# Patient Record
Sex: Male | Born: 1985 | Race: White | Hispanic: No | Marital: Married | Smoking: Former smoker
Health system: Southern US, Community
[De-identification: ages and names within clinical notes are randomized; demographics above are authoritative.]

## PROBLEM LIST (undated history)

## (undated) DIAGNOSIS — F419 Anxiety disorder, unspecified: Secondary | ICD-10-CM

## (undated) DIAGNOSIS — E291 Testicular hypofunction: Secondary | ICD-10-CM

## (undated) DIAGNOSIS — E559 Vitamin D deficiency, unspecified: Secondary | ICD-10-CM

## (undated) DIAGNOSIS — K219 Gastro-esophageal reflux disease without esophagitis: Secondary | ICD-10-CM

## (undated) DIAGNOSIS — B019 Varicella without complication: Secondary | ICD-10-CM

## (undated) DIAGNOSIS — K589 Irritable bowel syndrome without diarrhea: Secondary | ICD-10-CM

## (undated) HISTORY — DX: Irritable bowel syndrome, unspecified: K58.9

## (undated) HISTORY — DX: Testicular hypofunction: E29.1

## (undated) HISTORY — DX: Vitamin D deficiency, unspecified: E55.9

## (undated) HISTORY — DX: Varicella without complication: B01.9

## (undated) HISTORY — PX: TONSILLECTOMY: SHX5217

## (undated) HISTORY — DX: Gastro-esophageal reflux disease without esophagitis: K21.9

## (undated) HISTORY — DX: Anxiety disorder, unspecified: F41.9

---

## 2011-10-02 ENCOUNTER — Other Ambulatory Visit: Payer: Self-pay | Admitting: Occupational Medicine

## 2011-10-02 ENCOUNTER — Ambulatory Visit
Admission: RE | Admit: 2011-10-02 | Discharge: 2011-10-02 | Disposition: A | Discharge status: Home / Self Care | Payer: PRIVATE HEALTH INSURANCE | Source: Ambulatory Visit | Attending: Occupational Medicine | Admitting: Occupational Medicine

## 2011-10-02 DIAGNOSIS — Z021 Encounter for pre-employment examination: Secondary | ICD-10-CM

## 2011-11-03 HISTORY — PX: WISDOM TOOTH EXTRACTION: SHX21

## 2012-06-11 ENCOUNTER — Encounter (HOSPITAL_COMMUNITY): Payer: Self-pay | Admitting: *Deleted

## 2012-06-11 ENCOUNTER — Emergency Department (HOSPITAL_COMMUNITY)
Admission: EM | Admit: 2012-06-11 | Discharge: 2012-06-11 | Disposition: A | Discharge status: Home / Self Care | Payer: Worker's Compensation | Attending: Emergency Medicine | Admitting: Emergency Medicine

## 2012-06-11 DIAGNOSIS — Y9289 Other specified places as the place of occurrence of the external cause: Secondary | ICD-10-CM | POA: Insufficient documentation

## 2012-06-11 DIAGNOSIS — Y990 Civilian activity done for income or pay: Secondary | ICD-10-CM | POA: Insufficient documentation

## 2012-06-11 DIAGNOSIS — S0191XA Laceration without foreign body of unspecified part of head, initial encounter: Secondary | ICD-10-CM

## 2012-06-11 DIAGNOSIS — S0100XA Unspecified open wound of scalp, initial encounter: Secondary | ICD-10-CM | POA: Insufficient documentation

## 2012-06-11 DIAGNOSIS — W268XXA Contact with other sharp object(s), not elsewhere classified, initial encounter: Secondary | ICD-10-CM | POA: Insufficient documentation

## 2012-06-11 NOTE — ED Provider Notes (Signed)
Medical screening examination/treatment/procedure(s) were performed by non-physician practitioner and as supervising physician I was immediately available for consultation/collaboration.   Gwyneth Sprout, MD 06/11/12 1413

## 2012-06-11 NOTE — ED Provider Notes (Signed)
History     CSN: [id number]  Arrival date & time 06/11/12  1152   First MD Initiated Contact with Patient 06/11/12 1206      Chief Complaint  Patient presents with  . Head Laceration    (Consider location/radiation/quality/duration/timing/severity/associated sxs/prior treatment) HPI Comments: 26 y/o male presents with head laceration s/p hitting his head on the corner of the car door while trying to avoid being hit by a tree branch. Patient was at work and presents with his 2 co-workers. He is a Nurse, adult. Denies any LOC, dizziness, visual disturbance. States he has had a tetanus shot within the past 5 years.   Patient is a 26 y.o. male presenting with scalp laceration. The history is provided by the patient (and co-workers).  Head Laceration    History reviewed. No pertinent past medical history.  History reviewed. No pertinent past surgical history.  No family history on file.  History  Substance Use Topics  . Smoking status: Never Smoker   . Smokeless tobacco: Not on file  . Alcohol Use: Yes      Review of Systems  Eyes: Negative for visual disturbance.  Skin: Positive for wound.  Neurological: Negative for dizziness.       No LOC    Allergies  Review of patient's allergies indicates no known allergies.  Home Medications   Current Outpatient Rx  Name Route Sig Dispense Refill  . NAPROXEN SODIUM 220 MG PO TABS Oral Take 220 mg by mouth 2 (two) times daily with a meal.      BP 137/88  Resp 18  SpO2 97%  Physical Exam  Constitutional: He is oriented to person, place, and time. He appears well-developed and well-nourished. No distress.  HENT:  Head: Normocephalic.       1 cm superficial laceration on left side of scalp  Cardiovascular: Normal rate, regular rhythm and normal heart sounds.   Pulmonary/Chest: Effort normal and breath sounds normal.  Neurological: He is alert and oriented to person, place, and time. Coordination normal.  Skin: Skin is warm  and dry. Laceration (1 cm superficial laceration on left side of scalp. no evidence of foreign body.) noted.  Psychiatric: He has a normal mood and affect. His behavior is normal.    ED Course  Procedures (including critical care time) LACERATION REPAIR Performed by: Erik Stafford Authorized by: Erik Stafford Consent: Verbal consent obtained. Risks and benefits: risks, benefits and alternatives were discussed Consent given by: patient Patient identity confirmed: provided demographic data Prepped and Draped in normal sterile fashion Wound explored  Laceration Location: scalp  Laceration Length: 1cm  No Foreign Bodies seen or palpated  Anesthesia: local infiltration  Local anesthetic: lidocaine 2% without epinephrine  Anesthetic total: 1 ml  Irrigation method: syringe Amount of cleaning: standard  Skin closure: 3-0 prolene  Number of sutures: 1  Technique: simple interrupted  Patient tolerance: Patient tolerated the procedure well with no immediate complications.  Labs Reviewed - No data to display No results found.   1. Laceration of head       MDM  26 y/o male with scalp laceration s/p hitting on car door. No LOC. No foreign bodies seen or palpated. 1 suture places without problem. Patient in NAD. Had tetanus vaccine within past 5 years.        Erik Mace, PA-C 06/11/12 1256

## 2012-06-11 NOTE — ED Notes (Signed)
Pt states he hit his head on a car door as he leaned down to enter the car.  Pt denies LOC, N/V.  Pt has 2 inch lac to left medial skull.  Bleeding is controlled.

## 2012-09-20 ENCOUNTER — Ambulatory Visit: Payer: Self-pay | Admitting: Internal Medicine

## 2013-03-20 ENCOUNTER — Ambulatory Visit: Payer: Self-pay | Admitting: General Practice

## 2013-10-23 ENCOUNTER — Emergency Department (HOSPITAL_COMMUNITY)
Admission: EM | Admit: 2013-10-23 | Discharge: 2013-10-24 | Disposition: A | Discharge status: Home / Self Care | Payer: Worker's Compensation | Attending: Emergency Medicine | Admitting: Emergency Medicine

## 2013-10-23 ENCOUNTER — Encounter (HOSPITAL_COMMUNITY): Payer: Self-pay | Admitting: Emergency Medicine

## 2013-10-23 DIAGNOSIS — Z206 Contact with and (suspected) exposure to human immunodeficiency virus [HIV]: Secondary | ICD-10-CM

## 2013-10-23 DIAGNOSIS — Z7721 Contact with and (suspected) exposure to potentially hazardous body fluids: Secondary | ICD-10-CM | POA: Insufficient documentation

## 2013-10-23 DIAGNOSIS — Z791 Long term (current) use of non-steroidal anti-inflammatories (NSAID): Secondary | ICD-10-CM | POA: Insufficient documentation

## 2013-10-23 DIAGNOSIS — Z20828 Contact with and (suspected) exposure to other viral communicable diseases: Secondary | ICD-10-CM | POA: Insufficient documentation

## 2013-10-23 NOTE — ED Notes (Signed)
Pt was exposed to a person with known HIV, pt here for exposure panel and prophylaxis medications

## 2013-10-24 LAB — COMPREHENSIVE METABOLIC PANEL
ALT: 28 U/L (ref 0–53)
Alkaline Phosphatase: 62 U/L (ref 39–117)
CO2: 25 mEq/L (ref 19–32)
Chloride: 104 mEq/L (ref 96–112)
GFR calc Af Amer: >90 mL/min (ref >90)
GFR calc non Af Amer: >90 mL/min (ref >90)
Glucose, Bld: 90 mg/dL (ref 70–99)
Potassium: 3.9 mEq/L (ref 3.5–5.1)
Sodium: 141 mEq/L (ref 135–145)
Total Bilirubin: 0.4 mg/dL (ref 0.3–1.2)
Total Protein: 7.2 g/dL (ref 6.0–8.3)

## 2013-10-24 LAB — CBC WITH DIFFERENTIAL/PLATELET
Eosinophils Absolute: 0.1 10*3/uL (ref 0.0–0.7)
Hemoglobin: 15.6 g/dL (ref 13.0–17.0)
Lymphocytes Relative: 29 % (ref 12–46)
Lymphs Abs: 2.4 10*3/uL (ref 0.7–4.0)
MCH: 32.2 pg (ref 26.0–34.0)
Neutro Abs: 5.2 10*3/uL (ref 1.7–7.7)
Neutrophils Relative %: 64 % (ref 43–77)
Platelets: 179 10*3/uL (ref 150–400)
RBC: 4.85 MIL/uL (ref 4.22–5.81)
RDW: 12.7 % (ref 11.5–15.5)
WBC: 8.1 10*3/uL (ref 4.0–10.5)

## 2013-10-24 LAB — URINALYSIS, ROUTINE W REFLEX MICROSCOPIC
Leukocytes, UA: NEGATIVE
Nitrite: NEGATIVE
Protein, ur: NEGATIVE mg/dL
Specific Gravity, Urine: 1.017 (ref 1.005–1.030)
Urobilinogen, UA: 1 mg/dL (ref 0.0–1.0)
pH: 7.5 (ref 5.0–8.0)

## 2013-10-24 LAB — RAPID HIV SCREEN (WH-MAU): Rapid HIV Screen: NONREACTIVE

## 2013-10-24 LAB — HEPATITIS PANEL, ACUTE: Hepatitis B Surface Ag: NEGATIVE

## 2013-10-24 NOTE — ED Provider Notes (Signed)
CSN: [id number]     Arrival date & time 10/23/13  2356 History   First MD Initiated Contact with Patient 10/24/13 0007     Chief Complaint  Patient presents with  . 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 He has a healing scratch on his finger and the HIV positive patients blood got on his hand.  He would like to being prophylactic treatment for HIV. 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  History reviewed. No pertinent past medical history. History reviewed. No pertinent past surgical history. History reviewed. No pertinent family history. History  Substance Use Topics  . Smoking status: Never Smoker   . Smokeless tobacco: Not on file  . Alcohol Use: Yes    Review of Systems The patient denies anorexia, fever, weight loss,, vision loss, decreased hearing, hoarseness, chest pain, syncope, dyspnea on exertion, peripheral edema, balance deficits, hemoptysis, abdominal pain, melena, hematochezia, severe indigestion/heartburn, hematuria, incontinence, genital sores, muscle weakness, suspicious skin lesions, transient blindness, difficulty walking, depression, unusual weight change, abnormal bleeding, enlarged lymph nodes, angioedema, and breast masses.  Allergies  Review of patient's allergies indicates no known allergies.  Home Medications   Current Outpatient Rx  Name  Route  Sig  Dispense  Refill  . naproxen sodium (ANAPROX) 220 MG tablet   Oral   Take 220 mg by mouth 2 (two) times daily with a meal.          There were no vitals taken for this visit. Physical Exam  Nursing note and vitals reviewed. Constitutional: He appears well-developed and well-nourished. No distress.  HENT:  Head: Normocephalic and atraumatic.  Eyes: Pupils are  equal, round, and reactive to light.  Neck: Normal range of motion. Neck supple.  Cardiovascular: Normal rate and regular rhythm.   Pulmonary/Chest: Effort normal.  Abdominal: Soft.  Musculoskeletal:  Small healing scratch to finger.  Neurological: He is alert.  Skin: Skin is warm and dry.    ED Course  Procedures (including critical care time) Labs Review Labs Reviewed  RAPID HIV SCREEN ([facility])  HEPATITIS PANEL, ACUTE  URINALYSIS, ROUTINE W REFLEX MICROSCOPIC  CBC WITH DIFFERENTIAL  COMPREHENSIVE METABOLIC PANEL  AMYLASE   Imaging Review No results found.  EKG Interpretation   None       MDM   1. HIV exposure from body fluids    Patient has had all fluid exposure labs drawn. Some of the labs are send outs and will not be back in a timely manner. Will have patient follow-up with labs tomorrow with his Health Department  Pt has no other medical concerns at this time.  27 y.o.Dejion Salinas's evaluation in the Emergency Department is complete. It has been determined that no acute conditions requiring further emergency intervention are present at this time. The patient/guardian have been advised of the diagnosis and plan. We have discussed signs and symptoms that warrant return to the ED, such as changes or worsening in symptoms.  Vital signs are stable at discharge. There were no vitals filed for this visit.  Patient/guardian has voiced understanding and agreed to follow-up with the PCP or specialist.   Dorthula Matas, PA-C 10/24/13 0045

## 2013-10-24 NOTE — ED Provider Notes (Signed)
Medical screening examination/treatment/procedure(s) were performed by non-physician practitioner and as supervising physician I was immediately available for consultation/collaboration.    Olivia Mackie, MD 10/24/13 [phone]

## 2014-08-07 ENCOUNTER — Ambulatory Visit: Payer: Self-pay | Admitting: Physician Assistant

## 2014-08-14 ENCOUNTER — Encounter: Payer: Self-pay | Admitting: *Deleted

## 2014-08-23 ENCOUNTER — Ambulatory Visit: Payer: 59

## 2014-08-23 ENCOUNTER — Encounter: Payer: Self-pay | Admitting: General Surgery

## 2014-08-23 ENCOUNTER — Ambulatory Visit (INDEPENDENT_AMBULATORY_CARE_PROVIDER_SITE_OTHER): Payer: 59 | Admitting: General Surgery

## 2014-08-23 VITALS — BP 118/76 | HR 76 | Resp 12 | Ht 71.0 in | Wt 219.0 lb

## 2014-08-23 DIAGNOSIS — N6320 Unspecified lump in the left breast, unspecified quadrant: Secondary | ICD-10-CM

## 2014-08-23 DIAGNOSIS — N62 Hypertrophy of breast: Secondary | ICD-10-CM

## 2014-08-23 DIAGNOSIS — N63 Unspecified lump in breast: Secondary | ICD-10-CM

## 2014-08-23 NOTE — Patient Instructions (Signed)
The patient is aware to call back for any questions or concerns.  

## 2014-08-23 NOTE — Progress Notes (Signed)
Patient ID: Erik Stafford, male   DOB: 03/02/1986, 28 y.o.   MRN: [id number]  Chief Complaint  Patient presents with  . Breast Problem    left breast mass    HPI Erik Stafford is a 28 y.o. male.  Here today for evaluation of a left breast lump. States it has been there about a month. He does seem to remember at first it was larger but for 3 weeks it has been stable "quarter" size. Denies pain only a little tenderness. He is here today with his wife, Erik Stafford. Denies family history of breast cancer. No known history of trauma to the area. [place].  Served in [employer], reports sleep disturbances since that time.  His has been on Lexapo and Wellbutrin in the past.  HPI  Past Medical History  Diagnosis Date  . Anxiety   . Irritable bowel syndrome   . GERD (gastroesophageal reflux disease)     Past Surgical History  Procedure Laterality Date  . Tonsillectomy    . Wisdom tooth extraction  2013    Family History  Problem Relation Age of Onset  . Leukemia Paternal Uncle   . Cancer Paternal Grandmother     stomach    Social History History  Substance Use Topics  . Smoking status: Former Smoker -- 7 years    Quit date: 11/02/2013  . Smokeless tobacco: Never Used  . Alcohol Use: Yes     Comment: 3/week    Allergies  Allergen Reactions  . Skelaxin [Metaxalone] Other (See Comments)    migraine    Current Outpatient Prescriptions  Medication Sig Dispense Refill  . clonazePAM (KLONOPIN) 1 MG tablet Take 1 mg by mouth at bedtime.      . Multiple Vitamin (MULTIVITAMIN) capsule Take 1 capsule by mouth daily.      Marland Kitchen. omeprazole (PRILOSEC) 40 MG capsule Take 80 mg by mouth daily.       No current facility-administered medications for this visit.    Review of Systems Review of Systems  Constitutional: Negative.   Respiratory: Negative.   Cardiovascular: Negative.     Blood pressure 118/76, pulse 76, resp. rate 12, height 5\' 11"  (1.803 m), weight  219 lb (99.338 kg).  Physical Exam Physical Exam  Constitutional: He is oriented to person, place, and time. He appears well-developed and well-nourished.  Neck: Neck supple.  Cardiovascular: Normal rate, regular rhythm and normal heart sounds.   Pulmonary/Chest: Effort normal and breath sounds normal. Right breast exhibits no inverted nipple, no mass, no nipple discharge, no skin change and no tenderness. Left breast exhibits no inverted nipple, no mass, no nipple discharge, no skin change and no tenderness.  3 cm wide thickening, less than 1 cm deep left breast  Lymphadenopathy:    He has no cervical adenopathy.    He has no axillary adenopathy.  Neurological: He is alert and oriented to person, place, and time.  Skin: Skin is warm and dry.    Data Reviewed Ultrasound examination of the breasts shows the left retroareolar tissue to be 0.84 x 1. 05 x 1.30 cm.  The right retroareolar bud measures 0.49 x 0.65 x 1.05. BIRAD 2.   Assessment    Gynecomastia     Plan    Options for management reviewed: 1) biopsy to confirm clinical impression (declined today secondary to upcoming [employer] responsibilities vs 2) observation. With history of tenderness and swelling improving over last few weeks, this  seems reasonable. He is not presently, nor recently taken any drugs clearly linked to gynecomastia.  This may have been traumatic initially, and now resolving.  Will plan for reassessment in three months, earlier if the patient notes any changes.       PCP: Erik Stafford, Jr., Erik Stafford   Erik Stafford, Erik Stafford 08/26/2014, 8:52 AM

## 2014-08-26 DIAGNOSIS — N62 Hypertrophy of breast: Secondary | ICD-10-CM | POA: Insufficient documentation

## 2014-10-30 ENCOUNTER — Telehealth: Payer: Self-pay | Admitting: Internal Medicine

## 2014-10-30 NOTE — Telephone Encounter (Signed)
[facility]. Sent to Dr. Marina GoodellPerry. 10/30/14/ss

## 2014-11-13 ENCOUNTER — Ambulatory Visit: Payer: Self-pay | Admitting: Internal Medicine

## 2014-11-19 ENCOUNTER — Ambulatory Visit (INDEPENDENT_AMBULATORY_CARE_PROVIDER_SITE_OTHER): Payer: 59 | Admitting: General Surgery

## 2014-11-19 ENCOUNTER — Encounter: Payer: Self-pay | Admitting: General Surgery

## 2014-11-19 VITALS — BP 122/74 | HR 76 | Resp 12 | Ht 71.0 in | Wt 216.0 lb

## 2014-11-19 DIAGNOSIS — N62 Hypertrophy of breast: Secondary | ICD-10-CM

## 2014-11-19 NOTE — Patient Instructions (Signed)
Patient to return as needed. 

## 2014-11-19 NOTE — Progress Notes (Signed)
Patient ID: Erik Stafford, male   DOB: 11/05/1985, 29 y.o.   MRN: [id number]  Chief Complaint  Patient presents with  . Follow-up    gynecomastia    HPI Erik AgentJeffrey D Bosserman is a 29 y.o. male here today for his three month follow up gynecomastia. Patient states the left breast mass has gone down in side and no more pain. He states he noticed a mass in the right breast two months ago, not much pain. He states the mass in his right breast has not got bigger over the last two months. The patient is accompanied today by his wife. HPI  Past Medical History  Diagnosis Date  . Anxiety   . Irritable bowel syndrome   . GERD (gastroesophageal reflux disease)   . Chicken pox   . Vitamin D deficiency   . Hypogonadism in male     Past Surgical History  Procedure Laterality Date  . Tonsillectomy    . Wisdom tooth extraction  2013    Family History  Problem Relation Age of Onset  . Leukemia Paternal Uncle   . Cancer Paternal Grandmother     stomach    Social History History  Substance Use Topics  . Smoking status: Former Smoker -- 7 years    Quit date: 11/02/2013  . Smokeless tobacco: Never Used  . Alcohol Use: Yes     Comment: 3/week    Allergies  Allergen Reactions  . Skelaxin [Metaxalone] Other (See Comments)    migraine    Current Outpatient Prescriptions  Medication Sig Dispense Refill  . clonazePAM (KLONOPIN) 1 MG tablet Take 1 mg by mouth at bedtime.    . hydrOXYzine (ATARAX/VISTARIL) 25 MG tablet Take 1 tablet by mouth daily.    . methocarbamol (ROBAXIN) 500 MG tablet Take 500 mg by mouth 2 (two) times daily as needed for muscle spasms.    . Multiple Vitamin (MULTIVITAMIN) capsule Take 1 capsule by mouth daily.    Marland Kitchen. omeprazole (PRILOSEC) 40 MG capsule Take 80 mg by mouth daily.     No current facility-administered medications for this visit.    Review of Systems Review of Systems  Constitutional: Negative.   Eyes: Negative.   Respiratory: Negative.    Cardiovascular: Negative.     Blood pressure 122/74, pulse 76, resp. rate 12, height 5\' 11"  (1.803 m), weight 216 lb (97.977 kg).  Physical Exam Physical Exam  Constitutional: He is oriented to person, place, and time. He appears well-developed and well-nourished.  Eyes: Conjunctivae are normal. No scleral icterus.  Neck: Neck supple.  Cardiovascular: Normal rate, regular rhythm and normal heart sounds.   Pulmonary/Chest: Effort normal and breath sounds normal. Right breast exhibits no inverted nipple, no nipple discharge and no skin change. Left breast exhibits no inverted nipple, no mass, no nipple discharge and no skin change.  Left breast  thickening behind the areolar.  plate like thickening behind right areolar.   Abdominal: Soft. Bowel sounds are normal. There is no tenderness.  Lymphadenopathy:    He has no cervical adenopathy.  Neurological: He is alert and oriented to person, place, and time.  Skin: Skin is warm and dry.    Data Reviewed Previous ultrasound reviewed.  Assessment    Mild gynecomastia, asymptomatic.    Plan    Observation alone is warranted at this time. The patient was encouraged to call should he did note increasing size of either breast or the development of any pain or tenderness.   Patient to  return as needed.   PCP: Jonna Clark., Lina Sayre 11/20/2014, 8:07 PM

## 2015-02-19 IMAGING — CR DG CHEST 2V
1 series · 2 of 2 positions shown · non-contrast
Comparison: Mediastinal surgical be a month clear.

ADDENDUM:
In the above report under  'comparison:'  It should read 'none.'
CLINICAL DATA: Left breast mass.  Initial evaluation .

EXAM:
CHEST  2 VIEW

[Series 1: pa · 0.17mm/px · 2 of 2 slices shown]
[im 1/2]
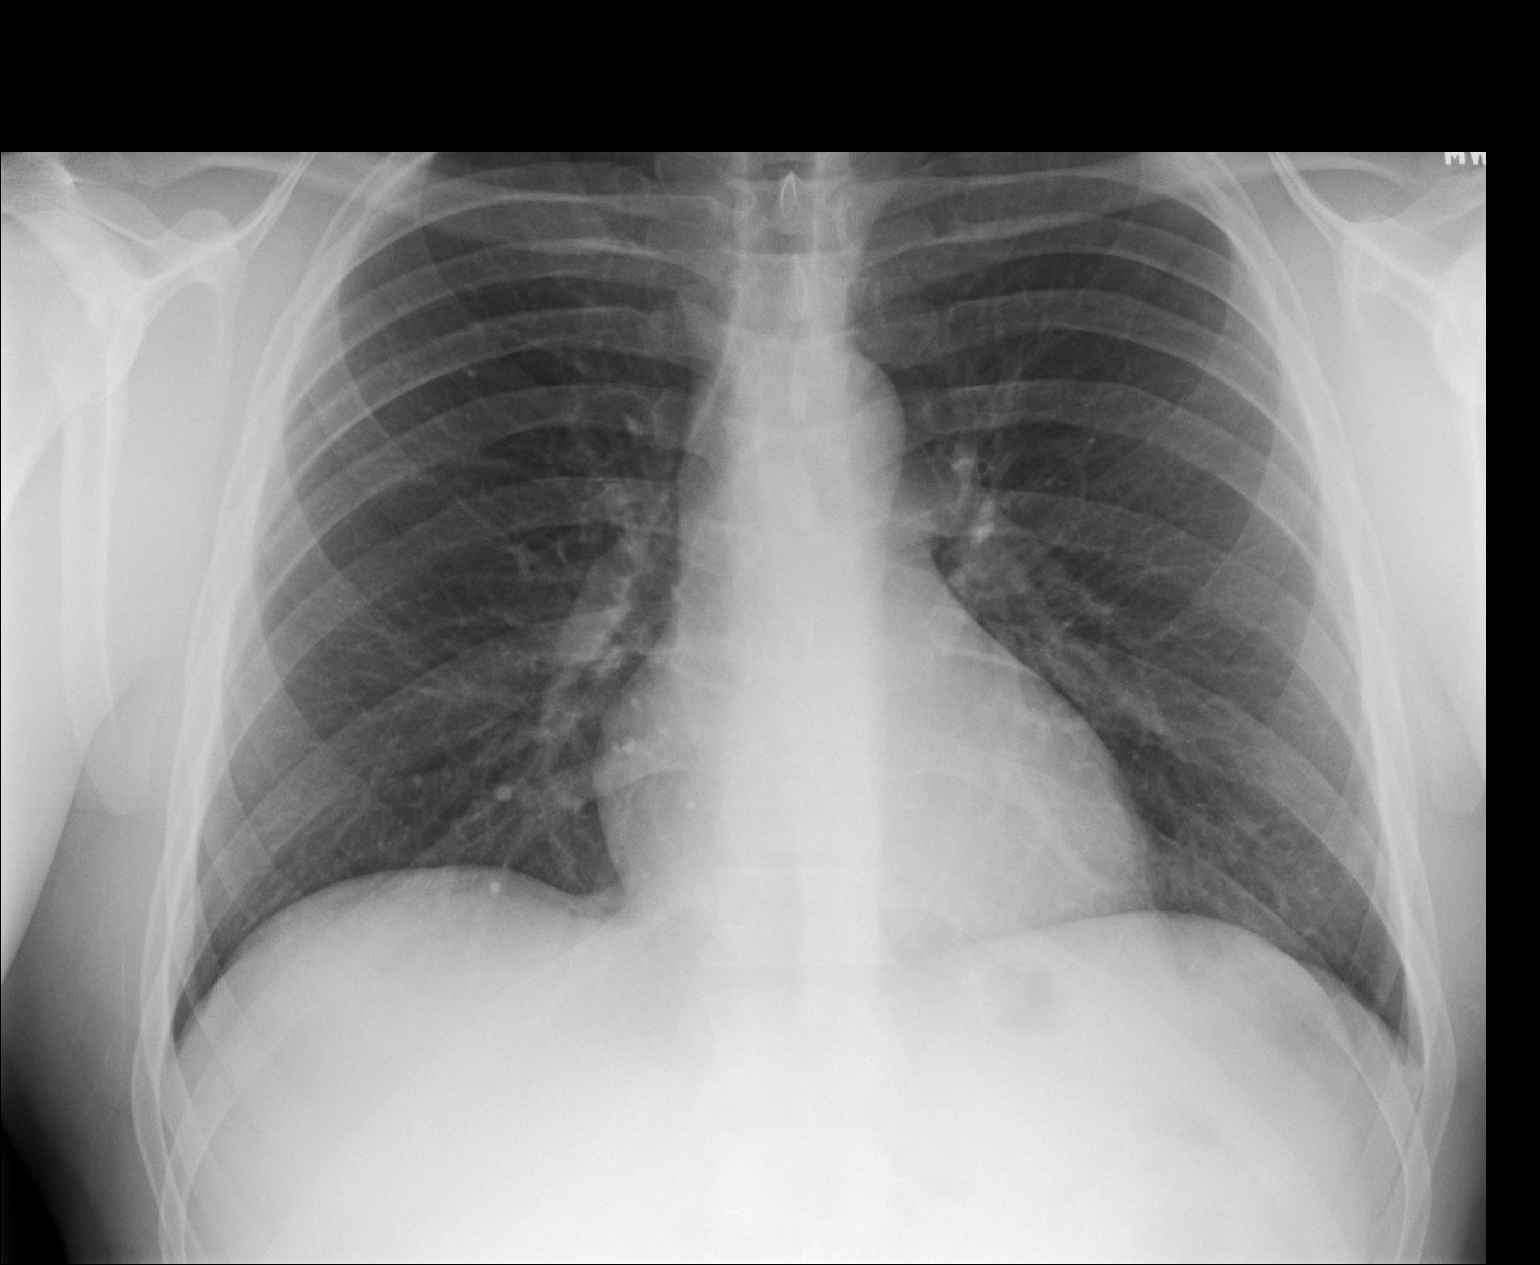
[im 2/2]
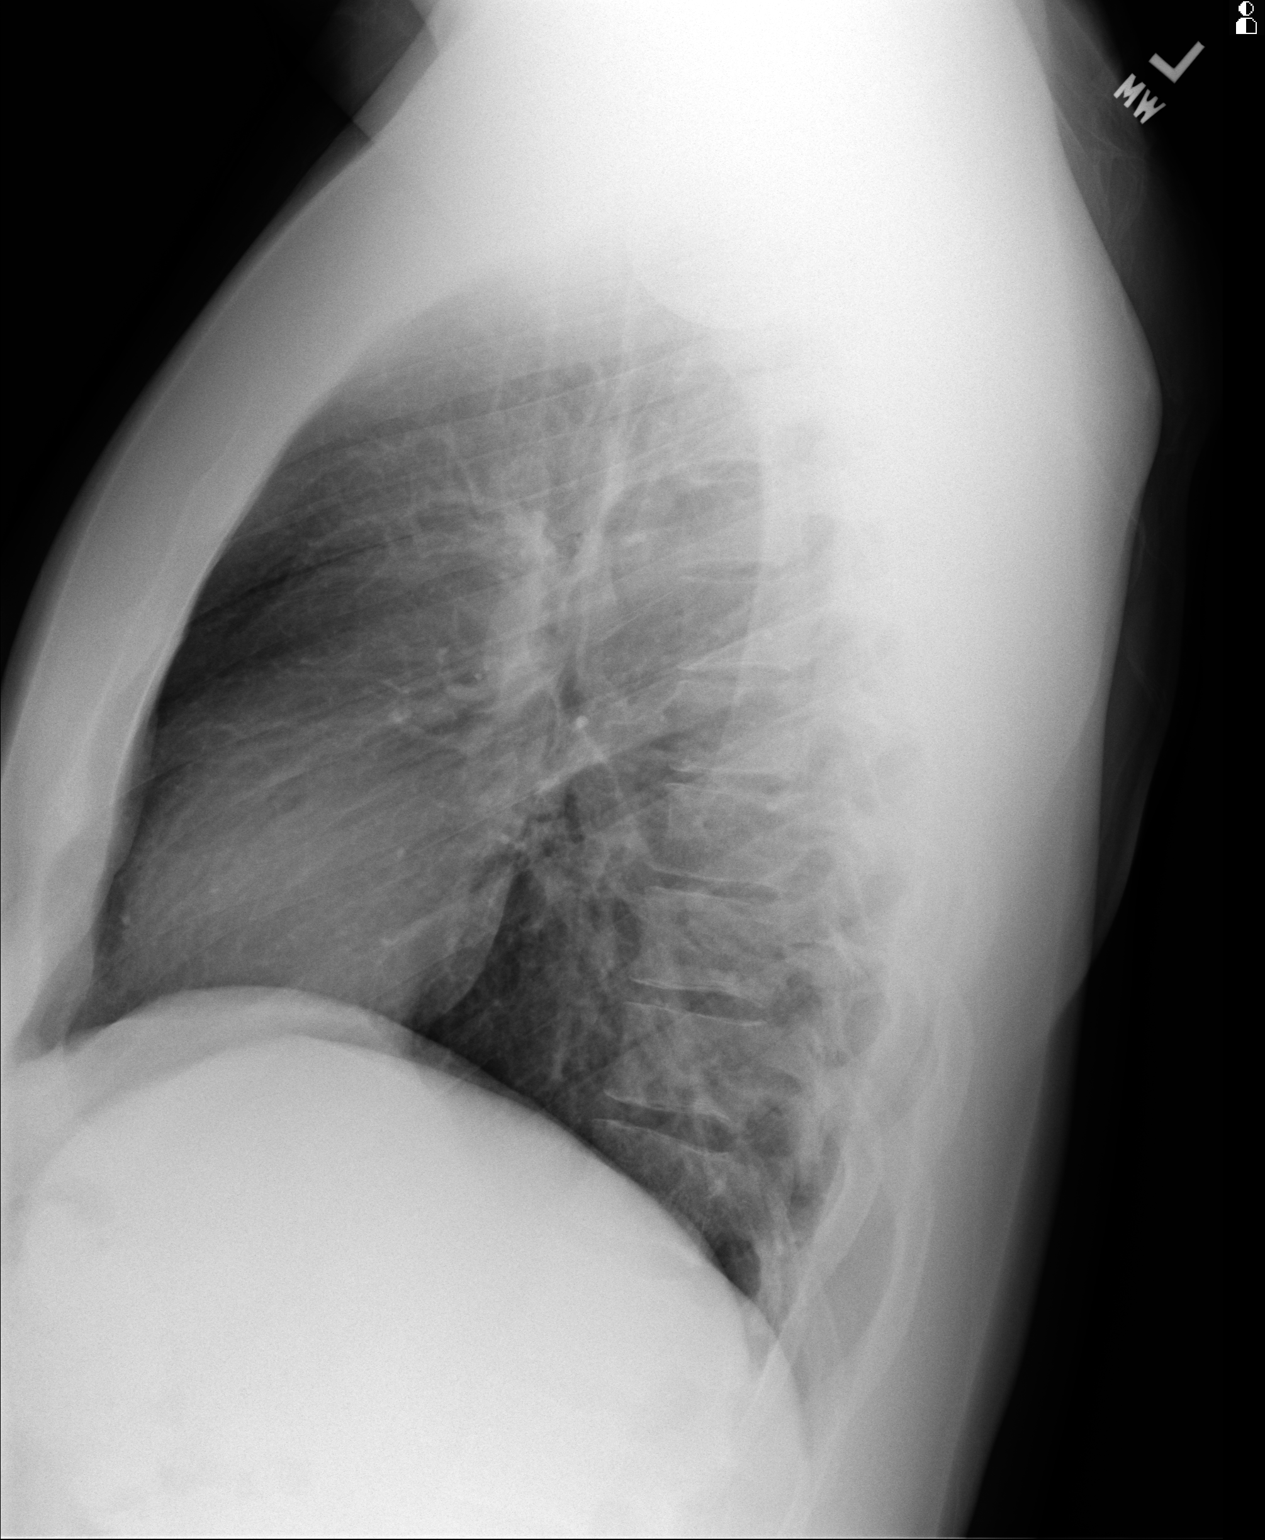

[2 of 2 positions shown; findings below may reference images not displayed]

Heart size
normal. No pleural effusion or pneumothorax. No focal bony
abnormality.
FINDINGS: The heart size and mediastinal contours are within normal limits.
Both lungs are clear. The visualized skeletal structures are
unremarkable.
IMPRESSION: No active cardiopulmonary disease.

## 2015-04-04 ENCOUNTER — Other Ambulatory Visit: Payer: Self-pay | Admitting: Family Medicine

## 2015-05-21 ENCOUNTER — Ambulatory Visit: Payer: 59 | Admitting: General Surgery

## 2015-07-04 ENCOUNTER — Encounter: Payer: Self-pay | Admitting: *Deleted

## 2016-04-22 ENCOUNTER — Other Ambulatory Visit: Payer: Self-pay | Admitting: Family Medicine

## 2016-11-05 DIAGNOSIS — Z79899 Other long term (current) drug therapy: Secondary | ICD-10-CM | POA: Diagnosis not present

## 2016-12-07 DIAGNOSIS — Z79899 Other long term (current) drug therapy: Secondary | ICD-10-CM | POA: Diagnosis not present

## 2016-12-14 DIAGNOSIS — M6208 Separation of muscle (nontraumatic), other site: Secondary | ICD-10-CM | POA: Diagnosis not present

## 2017-04-05 DIAGNOSIS — Z79899 Other long term (current) drug therapy: Secondary | ICD-10-CM | POA: Diagnosis not present
